# Patient Record
Sex: Male | Born: 1990 | Race: White | Hispanic: No | Marital: Single | Smoking: Never smoker
Health system: Southern US, Community
[De-identification: ages and names within clinical notes are randomized; demographics above are authoritative.]

## PROBLEM LIST (undated history)

## (undated) DIAGNOSIS — K4090 Unilateral inguinal hernia, without obstruction or gangrene, not specified as recurrent: Secondary | ICD-10-CM

## (undated) DIAGNOSIS — R51 Headache: Secondary | ICD-10-CM

## (undated) DIAGNOSIS — R519 Headache, unspecified: Secondary | ICD-10-CM

## (undated) HISTORY — PX: WISDOM TOOTH EXTRACTION: SHX21

## (undated) HISTORY — PX: FOREIGN BODY REMOVAL: SHX962

## (undated) HISTORY — DX: Unilateral inguinal hernia, without obstruction or gangrene, not specified as recurrent: K40.90

---

## 2016-03-06 ENCOUNTER — Encounter: Payer: Self-pay | Admitting: General Surgery

## 2016-03-06 ENCOUNTER — Other Ambulatory Visit: Payer: Self-pay

## 2016-03-06 ENCOUNTER — Ambulatory Visit (INDEPENDENT_AMBULATORY_CARE_PROVIDER_SITE_OTHER): Payer: Worker's Compensation | Admitting: General Surgery

## 2016-03-06 ENCOUNTER — Telehealth: Payer: Self-pay

## 2016-03-06 VITALS — BP 158/103 | HR 89 | Temp 98.3°F | Ht 70.0 in | Wt 254.0 lb

## 2016-03-06 DIAGNOSIS — K4090 Unilateral inguinal hernia, without obstruction or gangrene, not specified as recurrent: Secondary | ICD-10-CM | POA: Diagnosis not present

## 2016-03-06 NOTE — Progress Notes (Signed)
Patient ID: Scott Mckenzie, male   DOB: 02/26/1991, 25 y.o.   MRN: [id number]  CC: Left groin pain  HPI Scott Mckenzie is a 25 y.o. male who presents to clinic today for evaluation of left groin pain. Patient reports that 2 weeks ago to the day he was lifting a desk at work after which she felt a pop with gradual onset of pain to the groin. He thought area would resolve on its own but worsened overnight and had an obvious bulge the next morning. He sought medical care at that point and through an outpatient workup was found to have a symptomatic left inguinal hernia. The area has remained reducible but has been causing pain however he strains. He denies any fevers, chills, nausea, vomiting, chest pain, short of breath, diarrhea, constipation. He is otherwise a healthy 25 year old male.  HPI  Past Medical History:  Diagnosis Date  . Inguinal hernia    Left    Past Surgical History:  Procedure Laterality Date  . FOREIGN BODY REMOVAL     from ear- as child    Family History  Problem Relation Age of Onset  . Cancer Mother     Brain  . Diabetes Mother   . COPD Father   . Heart disease Father   . Healthy Sister     Social History Social History  Substance Use Topics  . Smoking status: Never Smoker  . Smokeless tobacco: Never Used  . Alcohol use Yes     Comment: 1-2 Beers weekly    Allergies  Allergen Reactions  . Penicillins Hives    No current outpatient prescriptions on file.   No current facility-administered medications for this visit.      Review of Systems A Multi-point review of systems was asked and was negative except for the findings documented in the history of present illness  Physical Exam Blood pressure (!) 158/103, pulse 89, temperature 98.3 F (36.8 C), temperature source Oral, height 5\' 10"  (1.778 m), weight 115.2 kg (254 lb). CONSTITUTIONAL: No acute distress. EYES: Pupils are equal, round, and reactive to light, Sclera are non-icteric. EARS, NOSE, MOUTH AND  THROAT: The oropharynx is clear. The oral mucosa is pink and moist. Hearing is intact to voice. LYMPH NODES:  Lymph nodes in the neck are normal. RESPIRATORY:  Lungs are clear. There is normal respiratory effort, with equal breath sounds bilaterally, and without pathologic use of accessory muscles. CARDIOVASCULAR: Heart is regular without murmurs, gallops, or rubs. GI: The abdomen is soft, tender to palpation in the left groin, and nondistended. There is an easily identifiable, reducible left inguinal hernia near the site of tenderness. No evidence of any right inguinal hernia on exam. There is no hepatosplenomegaly. There are normal bowel sounds in all quadrants. GU: Rectal deferred.   MUSCULOSKELETAL: Normal muscle strength and tone. No cyanosis or edema.   SKIN: Turgor is good and there are no pathologic skin lesions or ulcers. NEUROLOGIC: Motor and sensation is grossly normal. Cranial nerves are grossly intact. PSYCH:  Oriented to person, place and time. Affect is normal.  Data Reviewed Images were obtained at a different facility with a report showing evidence of a reducible left inguinal hernia. I have personally reviewed the patient's imaging, laboratory findings and medical records.    Assessment    Left inguinal hernia    Plan    25 year old male with a symptomatic left inguinal hernia. Had a long conversation of the treatment options to include open versus laparoscopic hernia repair.  Patient voiced understanding but an inability to choose between open or laparoscopic. His significant other is in the medical field but could not be present with him today and he will discuss with her which operation to choose. Patient is also dealing with a Workmen's Comp. situation secondary to this hernia. This may complicate his surgical care. Patient desires to have this surgery as soon as possible. Discussed that my soonest availability was not until [facility],  however, he could potentially have surgery next week by my partner Dr. Pat Patrick. He stated desire for surgery as soon as possible and will call on Monday to discuss which surgery he would like and whether or not he will proceed with Dr. Pat Patrick or myself.     Time spent with the patient was 45 minutes, with more than 50% of the time spent in face-to-face education, counseling and care coordination.     Clayburn Pert, MD FACS General Surgeon 03/06/2016, 11:55 AM

## 2016-03-06 NOTE — Telephone Encounter (Signed)
Dr. Adonis Huguenin told Scott Mckenzie that he was going to see if Dr. Pat Patrick can perform a surgery on him next week. Scott Mckenzie called to see if he was able to do that or not. Please call Scott Mckenzie and update him on Scott plan

## 2016-03-06 NOTE — Patient Instructions (Signed)
We have scheduled you for Surgery on 03/17/16 with Dr. Adonis Huguenin. Please call our office on Monday and let us know if you prefer to have Open or  Laparscopic. Inguinal Hernia,  Adult , Care After Refer to this sheet in the next few weeks. These discharge instructions provide you with general information on caring for yourself after you leave the hospital. Your caregiver may also give you specific instructions. Your treatment has been planned according to the most current medical practices available, but unavoidable complications sometimes occur. If you have any problems or questions after discharge, please call your caregiver. HOME CARE INSTRUCTIONS  Put ice on the operative site.  Put ice in a plastic bag.  Place a towel between your skin and the bag.  Leave the ice on for 15-20 minutes at a time, 03-04 times a day while awake.  Change bandages (dressings) as directed.  Keep the wound dry and clean. The wound may be washed gently with soap and water. Gently blot or dab the wound dry. It is okay to take showers 24 to 48 hours after surgery. Do not take baths, use swimming pools, or use hot tubs for 10 days, or as directed by your caregiver.  Only take over-the-counter or prescription medicines for pain, discomfort, or fever as directed by your caregiver.  Continue your normal diet as directed.  Do not lift anything more than 10 pounds or play contact sports for 3 weeks, or as directed. SEEK MEDICAL CARE IF:  There is redness, swelling, or increasing pain in the wound.  There is fluid (pus) coming from the wound.  There is drainage from a wound lasting longer than 1 day.  You have an oral temperature above 102 F (38.9 C).  You notice a bad smell coming from the wound or dressing.  The wound breaks open after the stitches (sutures) have been removed.  You notice increasing pain in the shoulders (shoulder strap areas).  You develop dizzy episodes or fainting while standing.  You  feel sick to your stomach (nauseous) or throw up (vomit). SEEK IMMEDIATE MEDICAL CARE IF:  You develop a rash.  You have difficulty breathing.  You develop a reaction or have side effects to medicines you were given. MAKE SURE YOU:   Understand these instructions.  Will watch your condition.  Will get help right away if you are not doing well or get worse.   This information is not intended to replace advice given to you by your health care provider. Make sure you discuss any questions you have with your health care provider.   Document Released: 07/09/2006 Document Revised: 06/29/2014 Document Reviewed: 12/10/2014 Elsevier Interactive Patient Education [employer].

## 2016-03-09 ENCOUNTER — Telehealth: Payer: Self-pay | Admitting: Surgery

## 2016-03-09 NOTE — Telephone Encounter (Signed)
Patient was given information below.   Sx: 03/12/16 with Dr Pat Patrick.  Laparoscopic left inguinal hernia repair, possible umbilical hernia repair.  Pre op: 03/11/16 between 1-5:00pm.--[facility].   Patient made aware to call [phone], between 1-3:00pm the day before surgery, to find out what time to arrive.

## 2016-03-11 ENCOUNTER — Encounter
Admission: RE | Admit: 2016-03-11 | Discharge: 2016-03-11 | Disposition: A | Discharge status: Home / Self Care | Payer: Worker's Compensation | Source: Ambulatory Visit | Attending: Surgery | Admitting: Surgery

## 2016-03-11 HISTORY — DX: Headache, unspecified: R51.9

## 2016-03-11 HISTORY — DX: Headache: R51

## 2016-03-11 MED ORDER — CLINDAMYCIN PHOSPHATE 900 MG/50ML IV SOLN
900.0000 mg | INTRAVENOUS | Status: AC
  Administered 2016-03-12: 900 mg via INTRAVENOUS

## 2016-03-11 NOTE — Patient Instructions (Signed)
  Your procedure is scheduled on: 03-12-16 Report to Same Day Surgery 2nd floor medical mall @ 6 AM PER PT    Remember: Instructions that are not followed completely may result in serious medical risk, up to and including death, or upon the discretion of your surgeon and anesthesiologist your surgery may need to be rescheduled.    _x___ 1. Do not eat food or drink liquids after midnight. No gum chewing or hard candies.     __x__ 2. No Alcohol for 24 hours before or after surgery.   __x__3. No Smoking for 24 prior to surgery.   ____  4. Bring all medications with you on the day of surgery if instructed.    __x__ 5. Notify your doctor if there is any change in your medical condition     (cold, fever, infections).     Do not wear jewelry, make-up, hairpins, clips or nail polish.  Do not wear lotions, powders, or perfumes. You may wear deodorant.  Do not shave 48 hours prior to surgery. Men may shave face and neck.  Do not bring valuables to the hospital.    [facility] is not responsible for any belongings or valuables.               Contacts, dentures or bridgework may not be worn into surgery.  Leave your suitcase in the car. After surgery it may be brought to your room.  For patients admitted to the hospital, discharge time is determined by your treatment team.   Patients discharged the day of surgery will not be allowed to drive home.    Please read over the following fact sheets that you were given:   [facility] Preparing for Surgery and or MRSA Information   ____ Take these medicines the morning of surgery with A SIP OF WATER:    1. NONE  2.  3.  4.  5.  6.  ____Fleets enema or Magnesium Citrate as directed.   ____ Use CHG Soap or sage wipes as directed on instruction sheet   ____ Use inhalers on the day of surgery and bring to hospital day of surgery  ____ Stop metformin 2 days prior to surgery    ____ Take 1/2 of usual insulin dose the night before surgery and  none on the morning of surgery.   ____ Stop aspirin or coumadin, or plavix  x__ Stop Anti-inflammatories such as Advil, Aleve, Ibuprofen, Motrin, Naproxen,          Naprosyn, Goodies powders or aspirin products. Ok to take Tylenol.   ____ Stop supplements until after surgery.    ____ Bring C-Pap to the hospital.

## 2016-03-12 ENCOUNTER — Ambulatory Visit: Payer: Worker's Compensation | Admitting: Anesthesiology

## 2016-03-12 ENCOUNTER — Ambulatory Visit
Admission: RE | Admit: 2016-03-12 | Discharge: 2016-03-12 | Disposition: A | Discharge status: Home / Self Care | Payer: Worker's Compensation | Source: Ambulatory Visit | Attending: Surgery | Admitting: Surgery

## 2016-03-12 ENCOUNTER — Encounter
Admission: RE | Disposition: A | Discharge status: Home / Self Care | Payer: Self-pay | Source: Ambulatory Visit | Attending: Surgery

## 2016-03-12 DIAGNOSIS — Z880 Allergy status to penicillin: Secondary | ICD-10-CM | POA: Insufficient documentation

## 2016-03-12 DIAGNOSIS — R1030 Lower abdominal pain, unspecified: Secondary | ICD-10-CM | POA: Insufficient documentation

## 2016-03-12 DIAGNOSIS — Z833 Family history of diabetes mellitus: Secondary | ICD-10-CM | POA: Diagnosis not present

## 2016-03-12 DIAGNOSIS — Z808 Family history of malignant neoplasm of other organs or systems: Secondary | ICD-10-CM | POA: Diagnosis not present

## 2016-03-12 DIAGNOSIS — Z9889 Other specified postprocedural states: Secondary | ICD-10-CM | POA: Diagnosis not present

## 2016-03-12 DIAGNOSIS — D176 Benign lipomatous neoplasm of spermatic cord: Secondary | ICD-10-CM | POA: Diagnosis not present

## 2016-03-12 DIAGNOSIS — Z825 Family history of asthma and other chronic lower respiratory diseases: Secondary | ICD-10-CM | POA: Diagnosis not present

## 2016-03-12 DIAGNOSIS — K4090 Unilateral inguinal hernia, without obstruction or gangrene, not specified as recurrent: Secondary | ICD-10-CM | POA: Diagnosis present

## 2016-03-12 HISTORY — PX: INGUINAL HERNIA REPAIR: SHX194

## 2016-03-12 SURGERY — REPAIR, HERNIA, INGUINAL, LAPAROSCOPIC
Anesthesia: General | Wound class: Clean

## 2016-03-12 MED ORDER — CHLORHEXIDINE GLUCONATE CLOTH 2 % EX PADS
6.0000 | MEDICATED_PAD | Freq: Once | CUTANEOUS | Status: AC
  Administered 2016-03-12: 6 via TOPICAL

## 2016-03-12 MED ORDER — LACTATED RINGERS IV SOLN
INTRAVENOUS | Status: DC
  Administered 2016-03-12 (×2): via INTRAVENOUS

## 2016-03-12 MED ORDER — CLINDAMYCIN PHOSPHATE 900 MG/50ML IV SOLN
INTRAVENOUS | Status: AC
  Administered 2016-03-12: 900 mg via INTRAVENOUS
  Filled 2016-03-12: qty 50

## 2016-03-12 MED ORDER — IPRATROPIUM-ALBUTEROL 0.5-2.5 (3) MG/3ML IN SOLN
RESPIRATORY_TRACT | Status: AC
  Administered 2016-03-12: 3 mL via RESPIRATORY_TRACT
  Filled 2016-03-12: qty 3

## 2016-03-12 MED ORDER — FENTANYL CITRATE (PF) 100 MCG/2ML IJ SOLN
INTRAMUSCULAR | Status: DC | PRN
  Administered 2016-03-12: 50 ug via INTRAVENOUS
  Administered 2016-03-12: 100 ug via INTRAVENOUS
  Administered 2016-03-12: 50 ug via INTRAVENOUS

## 2016-03-12 MED ORDER — MIDAZOLAM HCL 2 MG/2ML IJ SOLN
INTRAMUSCULAR | Status: DC | PRN
  Administered 2016-03-12: 2 mg via INTRAVENOUS

## 2016-03-12 MED ORDER — ONDANSETRON HCL 4 MG/2ML IJ SOLN: 4.0000 mg | Freq: Once | INTRAMUSCULAR | Status: DC | PRN

## 2016-03-12 MED ORDER — LIDOCAINE HCL (CARDIAC) 20 MG/ML IV SOLN
INTRAVENOUS | Status: DC | PRN
  Administered 2016-03-12: 30 mg via INTRAVENOUS

## 2016-03-12 MED ORDER — ONDANSETRON HCL 4 MG/2ML IJ SOLN
INTRAMUSCULAR | Status: DC | PRN
  Administered 2016-03-12: 4 mg via INTRAVENOUS

## 2016-03-12 MED ORDER — PROPOFOL 10 MG/ML IV BOLUS
INTRAVENOUS | Status: DC | PRN
  Administered 2016-03-12: 250 mg via INTRAVENOUS

## 2016-03-12 MED ORDER — DEXAMETHASONE SODIUM PHOSPHATE 10 MG/ML IJ SOLN
INTRAMUSCULAR | Status: DC | PRN
  Administered 2016-03-12: 10 mg via INTRAVENOUS

## 2016-03-12 MED ORDER — BUPIVACAINE HCL (PF) 0.25 % IJ SOLN
INTRAMUSCULAR | Status: AC
  Filled 2016-03-12: qty 30

## 2016-03-12 MED ORDER — HYDROCODONE-ACETAMINOPHEN 5-325 MG PO TABS
ORAL_TABLET | ORAL | Status: AC
  Filled 2016-03-12: qty 1

## 2016-03-12 MED ORDER — SUGAMMADEX SODIUM 500 MG/5ML IV SOLN
INTRAVENOUS | Status: DC | PRN
  Administered 2016-03-12: 226.8 mg via INTRAVENOUS

## 2016-03-12 MED ORDER — FENTANYL CITRATE (PF) 100 MCG/2ML IJ SOLN
INTRAMUSCULAR | Status: AC
  Administered 2016-03-12: 25 ug via INTRAVENOUS
  Filled 2016-03-12: qty 2

## 2016-03-12 MED ORDER — ROCURONIUM BROMIDE 100 MG/10ML IV SOLN
INTRAVENOUS | Status: DC | PRN
  Administered 2016-03-12: 50 mg via INTRAVENOUS
  Administered 2016-03-12: 20 mg via INTRAVENOUS
  Administered 2016-03-12: 10 mg via INTRAVENOUS

## 2016-03-12 MED ORDER — IPRATROPIUM-ALBUTEROL 0.5-2.5 (3) MG/3ML IN SOLN
3.0000 mL | Freq: Once | RESPIRATORY_TRACT | Status: AC
  Administered 2016-03-12: 3 mL via RESPIRATORY_TRACT

## 2016-03-12 MED ORDER — BUPIVACAINE HCL (PF) 0.5 % IJ SOLN
INTRAMUSCULAR | Status: DC | PRN
  Administered 2016-03-12: 30 mL

## 2016-03-12 MED ORDER — HYDROCODONE-ACETAMINOPHEN 5-325 MG PO TABS
1.0000 | ORAL_TABLET | Freq: Four times a day (QID) | ORAL | Status: AC | PRN
  Administered 2016-03-12: 1 via ORAL

## 2016-03-12 MED ORDER — FENTANYL CITRATE (PF) 100 MCG/2ML IJ SOLN
25.0000 ug | INTRAMUSCULAR | Status: DC | PRN
  Administered 2016-03-12 (×4): 25 ug via INTRAVENOUS

## 2016-03-12 MED ORDER — KETOROLAC TROMETHAMINE 30 MG/ML IJ SOLN
INTRAMUSCULAR | Status: DC | PRN
  Administered 2016-03-12: 30 mg via INTRAVENOUS

## 2016-03-12 MED ORDER — FAMOTIDINE 20 MG PO TABS
20.0000 mg | ORAL_TABLET | Freq: Once | ORAL | Status: AC
  Administered 2016-03-12: 20 mg via ORAL

## 2016-03-12 MED ORDER — HYDROCODONE-ACETAMINOPHEN 5-325 MG PO TABS: 1.0000 | ORAL_TABLET | Freq: Four times a day (QID) | ORAL | 0 refills | Status: DC | PRN

## 2016-03-12 MED ORDER — FAMOTIDINE 20 MG PO TABS
ORAL_TABLET | ORAL | Status: AC
  Administered 2016-03-12: 20 mg via ORAL
  Filled 2016-03-12: qty 1

## 2016-03-12 SURGICAL SUPPLY — 56 items
BLADE CLIPPER SURG (BLADE) ×4 IMPLANT
BLADE SURG 15 STRL LF DISP TIS (BLADE) ×2 IMPLANT
BLADE SURG 15 STRL SS (BLADE) ×2
CANISTER SUCT 1200ML W/VALVE (MISCELLANEOUS) ×4 IMPLANT
CHLORAPREP W/TINT 26ML (MISCELLANEOUS) ×4 IMPLANT
CLOSURE WOUND 1/2 X4 (GAUZE/BANDAGES/DRESSINGS)
DEVICE PMI PUNCTURE CLOSURE (MISCELLANEOUS) ×4 IMPLANT
DEVICE SECURE STRAP 25 ABSORB (INSTRUMENTS) IMPLANT
DISSECT BALLN SPACEMKR OVL PDB (BALLOONS)
DISSECT BALLN SPACEMKR RND PDB (MISCELLANEOUS)
DISSECTOR BALLN SPCMKR OVL PDB (BALLOONS) IMPLANT
DISSECTOR BALLN SPCMKR RND PDB (MISCELLANEOUS) IMPLANT
DRAPE LAPAROTOMY 100X77 ABD (DRAPES) IMPLANT
DRSG TEGADERM 2-3/8X2-3/4 SM (GAUZE/BANDAGES/DRESSINGS) ×12 IMPLANT
DRSG TELFA 3X8 NADH (GAUZE/BANDAGES/DRESSINGS) ×4 IMPLANT
ELECT CAUTERY NEEDLE TIP 1.0 (MISCELLANEOUS) ×4
ELECT REM PT RETURN 9FT ADLT (ELECTROSURGICAL) ×4
ELECTRODE CAUTERY NEDL TIP 1.0 (MISCELLANEOUS) ×2 IMPLANT
ELECTRODE REM PT RTRN 9FT ADLT (ELECTROSURGICAL) ×2 IMPLANT
GAUZE SPONGE 4X4 12PLY STRL (GAUZE/BANDAGES/DRESSINGS) IMPLANT
GLOVE BIO SURGEON STRL SZ7.5 (GLOVE) ×16 IMPLANT
GLOVE INDICATOR 8.0 STRL GRN (GLOVE) ×8 IMPLANT
GOWN STRL REUS W/ TWL LRG LVL3 (GOWN DISPOSABLE) ×4 IMPLANT
GOWN STRL REUS W/TWL LRG LVL3 (GOWN DISPOSABLE) ×4
IRRIGATION STRYKERFLOW (MISCELLANEOUS) IMPLANT
IRRIGATOR STRYKERFLOW (MISCELLANEOUS)
IV NS 1000ML (IV SOLUTION)
IV NS 1000ML BAXH (IV SOLUTION) IMPLANT
KIT RM TURNOVER STRD PROC AR (KITS) ×4 IMPLANT
LABEL OR SOLS (LABEL) ×4 IMPLANT
MESH HERNIA 4X6 PROLITE RECT (Mesh General) ×2 IMPLANT
MESH POLY 4X6 (Mesh General) ×2 IMPLANT
NEEDLE HYPO 25X1 1.5 SAFETY (NEEDLE) ×4 IMPLANT
NEEDLE INSUFFLATION 14GA 120MM (NEEDLE) ×4 IMPLANT
NS IRRIG 500ML POUR BTL (IV SOLUTION) ×4 IMPLANT
PACK BASIN MINOR ARMC (MISCELLANEOUS) ×4 IMPLANT
PACK LAP CHOLECYSTECTOMY (MISCELLANEOUS) ×4 IMPLANT
SCISSORS METZENBAUM CVD 33 (INSTRUMENTS) ×4 IMPLANT
SLEEVE ADV FIXATION 5X100MM (TROCAR) ×4 IMPLANT
STAPLER SKIN PROX 35W (STAPLE) IMPLANT
STRAP SAFETY BODY (MISCELLANEOUS) ×4 IMPLANT
STRIP CLOSURE SKIN 1/2X4 (GAUZE/BANDAGES/DRESSINGS) IMPLANT
SURGILUBE 2OZ TUBE FLIPTOP (MISCELLANEOUS) ×4 IMPLANT
SUT ETHILON 5-0 FS-2 18 BLK (SUTURE) ×4 IMPLANT
SUT SILK 3 0 (SUTURE)
SUT SILK 3-0 18XBRD TIE 12 (SUTURE) IMPLANT
SUT VIC AB 0 CT2 27 (SUTURE) ×4 IMPLANT
SUT VICRYL+ 3-0 144IN (SUTURE) IMPLANT
SUT VICRYL+ 3-0 36IN CT-1 (SUTURE) IMPLANT
SYRINGE 10CC LL (SYRINGE) IMPLANT
TACKER 5MM HERNIA 3.5CML NAB (ENDOMECHANICALS) ×4 IMPLANT
TRAY FOLEY W/METER SILVER 16FR (SET/KITS/TRAYS/PACK) ×4 IMPLANT
TROCAR BALLN 10M OMST10SB SPAC (TROCAR) IMPLANT
TROCAR Z-THREAD FIOS 11X100 BL (TROCAR) ×4 IMPLANT
TROCAR Z-THREAD OPTICAL 5X100M (TROCAR) ×4 IMPLANT
TUBING INSUFFLATOR HI FLOW (MISCELLANEOUS) ×4 IMPLANT

## 2016-03-12 NOTE — Op Note (Signed)
03/12/2016  9:10 AM  PATIENT:  Scott Mckenzie  25 y.o. male  PRE-OPERATIVE DIAGNOSIS:  LEFT INGUINAL HERNIA  POST-OPERATIVE DIAGNOSIS:  LEFT INGUINAL HERNIA  PROCEDURE:  Procedure(s): LAPAROSCOPIC INGUINAL HERNIA (Left)  SURGEON:  Surgeon(s) and Role:    * Dia Crawford III, MD - Primary   ASSISTANTS: none   ANESTHESIA:   general  EBL:  Total I/O In: -  Out: 175 [Urine:150; Blood:25]   DRAINS: none   LOCAL MEDICATIONS USED:  MARCAINE      DISPOSITION OF SPECIMEN:  N/A   DICTATION: .Dragon Dictation  With the patient in supine position and after induction of appropriate general anesthesia and placement of Foley catheter the patient's abdomen was prepped with ChloraPrep and draped sterile towels. Patient placed headdown feet up position. Small infraumbilical incision was made standard fashion carried down bluntly through subcutaneous tissue. First needle was used to cannulate peritoneal cavity but I could not get adequate pressure measurements. Using the Optiview trocar the abdomen was then cannulated without difficulty. The camera was inserted and CO2 was reinsufflated. Position was confirmed. The left lower quadrant and the left colon was densely adherent to the lateral sidewall in what appeared to be a previous inflammatory episodes. No obvious indirect or direct hernia was identified. The bowel was taken down gently to avoid any bowel damage. The peritoneum was opened to expose the cord structures. There was an indirect defect noted with a large cord lipoma. The lipoma was reduced into the abdominal cavity. Peritoneal structures were dissected over to the umbilical fold. Cooper's ligament was identified. The epigastric vessels were identified as were the vas deferens and cord blood vessels. A piece of Atrium mesh was brought to the table appropriately fashioned inserted into the preperitoneal space. It was tacked medially to Cooper's ligament up around the aponeurotic arch. No tacks were  placed in the inferior or lateral aspects.  The peritoneum was again mobilized as it was quite contracted and the area of the previous sigmoid problem. The peritoneum was then tacked back over the mesh to exclude it from the GI contents. The repair appeared to be satisfactory. Again was desufflated and all ports withdrawn without difficulty. Skin incisions were closed with 5-0 nylon after closing the umbilical defect with interrupted sutures figure-of-eight suture of 0 Vicryl. Skin was infiltrated with 0.25% Marcaine for postoperative pain control. Sterile dressings were applied. The patient returned recovery room in satisfactory condition. Sponge instrument and needle count were correct 2 in the operating room.  PLAN OF CARE: Discharge to home after PACU  PATIENT DISPOSITION:  PACU - hemodynamically stable.   Dia Crawford III, MD

## 2016-03-12 NOTE — Transfer of Care (Signed)
Immediate Anesthesia Transfer of Care Note  Patient: Junior Fetterhoff  Procedure(s) Performed: Procedure(s): LAPAROSCOPIC INGUINAL HERNIA (Left)  Patient Location: PACU  Anesthesia Type:General  Level of Consciousness: awake, alert  and oriented  Airway & Oxygen Therapy: Patient Spontanous Breathing and Patient connected to face mask oxygen  Post-op Assessment: Report given to RN and Post -op Vital signs reviewed and stable  Post vital signs: Reviewed and stable  Last Vitals:  Vitals:   03/12/16 0611 03/12/16 0918  BP: (!) 141/101 (!) 149/91  Pulse: 86 97  Resp: 16 12  Temp: 36.8 C 36.9 C    Last Pain:  Vitals:   03/12/16 0611  TempSrc: Tympanic         Complications: No apparent anesthesia complications

## 2016-03-12 NOTE — Discharge Instructions (Signed)
AMBULATORY SURGERY  DISCHARGE INSTRUCTIONS   1) The drugs that you were given will stay in your system until tomorrow so for the next 24 hours you should not:  A) Drive an automobile B) Make any legal decisions C) Drink any alcoholic beverage   2) You may resume regular meals tomorrow.  Today it is better to start with liquids and gradually work up to solid foods.  You may eat anything you prefer, but it is better to start with liquids, then soup and crackers, and gradually work up to solid foods.   3) Please notify your doctor immediately if you have any unusual bleeding, trouble breathing, redness and pain at the surgery site, drainage, fever, or pain not relieved by medication.    4) Additional Instructions:        Please contact your physician with any problems or Same Day Surgery at [phone], Monday through Friday 6 am to 4 pm, or [place] [facility] number at [phone].Inguinal Hernia, Adult Muscles help keep everything in the body in its proper place. But if a weak spot in the muscles develops, something can poke through. That is called a hernia. When this happens in the lower part of the belly (abdomen), it is called an inguinal hernia. (It takes its name from a part of the body in this region called the inguinal canal.) A weak spot in the wall of muscles lets some fat or part of the small intestine bulge through. An inguinal hernia can develop at any age. Men get them more often than women. CAUSES  In adults, an inguinal hernia develops over time.  It can be triggered by:  Suddenly straining the muscles of the lower abdomen.  Lifting heavy objects.  Straining to have a bowel movement. Difficult bowel movements (constipation) can lead to this.  Constant coughing. This may be caused by smoking or lung disease.  Being overweight.  Being pregnant.  Working at a job that requires long periods of standing or heavy lifting.  Having had an inguinal  hernia before. One type can be an emergency situation. It is called a strangulated inguinal hernia. It develops if part of the small intestine slips through the weak spot and cannot get back into the abdomen. The blood supply can be cut off. If that happens, part of the intestine may die. This situation requires emergency surgery. SYMPTOMS  Often, a small inguinal hernia has no symptoms. It is found when a healthcare provider does a physical exam. Larger hernias usually have symptoms.   In adults, symptoms may include:  A lump in the groin. This is easier to see when the person is standing. It might disappear when lying down.  In men, a lump in the scrotum.  Pain or burning in the groin. This occurs especially when lifting, straining or coughing.  A dull ache or feeling of pressure in the groin.  Signs of a strangulated hernia can include:  A bulge in the groin that becomes very painful and tender to the touch.  A bulge that turns red or purple.  Fever, nausea and vomiting.  Inability to have a bowel movement or to pass gas. DIAGNOSIS  To decide if you have an inguinal hernia, a healthcare provider will probably do a physical examination.  This will include asking questions about any symptoms you have noticed.  The healthcare provider might feel the groin area and ask you to cough. If an inguinal hernia is felt, the healthcare provider may try  to slide it back into the abdomen.  Usually no other tests are needed. TREATMENT  Treatments can vary. The size of the hernia makes a difference. Options include:  Watchful waiting. This is often suggested if the hernia is small and you have had no symptoms.  No medical procedure will be done unless symptoms develop.  You will need to watch closely for symptoms. If any occur, contact your healthcare provider right away.  Surgery. This is used if the hernia is larger or you have symptoms.  Open surgery. This is usually an outpatient  procedure (you will not stay overnight in a hospital). An cut (incision) is made through the skin in the groin. The hernia is put back inside the abdomen. The weak area in the muscles is then repaired by herniorrhaphy or hernioplasty. Herniorrhaphy: in this type of surgery, the weak muscles are sewn back together. Hernioplasty: a patch or mesh is used to close the weak area in the abdominal wall.  Laparoscopy. In this procedure, a surgeon makes small incisions. A thin tube with a tiny video camera (called a laparoscope) is put into the abdomen. The surgeon repairs the hernia with mesh by looking with the video camera and using two long instruments. HOME CARE INSTRUCTIONS   After surgery to repair an inguinal hernia:  You will need to take pain medicine prescribed by your healthcare provider. Follow all directions carefully.  You will need to take care of the wound from the incision.  Your activity will be restricted for awhile. This will probably include no heavy lifting for several weeks. You also should not do anything too active for a few weeks. When you can return to work will depend on the type of job that you have.  During "watchful waiting" periods, you should:  Maintain a healthy weight.  Eat a diet high in fiber (fruits, vegetables and whole grains).  Drink plenty of fluids to avoid constipation. This means drinking enough water and other liquids to keep your urine clear or pale yellow.  Do not lift heavy objects.  Do not stand for long periods of time.  Quit smoking. This should keep you from developing a frequent cough. SEEK MEDICAL CARE IF:   A bulge develops in your groin area.  You feel pain, a burning sensation or pressure in the groin. This might be worse if you are lifting or straining.  You develop a fever of more than 100.5 F (38.1 C). SEEK IMMEDIATE MEDICAL CARE IF:   Pain in the groin increases suddenly.  A bulge in the groin gets bigger suddenly and does  not go down.  For men, there is sudden pain in the scrotum. Or, the size of the scrotum increases.  A bulge in the groin area becomes red or purple and is painful to touch.  You have nausea or vomiting that does not go away.  You feel your heart beating much faster than normal.  You cannot have a bowel movement or pass gas.  You develop a fever of more than 102.0 F (38.9 C).   This information is not intended to replace advice given to you by your health care provider. Make sure you discuss any questions you have with your health care provider.   Document Released: 10/25/2008 Document Revised: 08/31/2011 Document Reviewed: 12/10/2014 Elsevier Interactive Patient Education [employer].

## 2016-03-12 NOTE — Anesthesia Procedure Notes (Signed)
Procedure Name: Intubation Date/Time: 03/12/2016 7:33 AM Performed by: Jonna Clark Pre-anesthesia Checklist: Patient identified, Patient being monitored, Timeout performed, Emergency Drugs available and Suction available Patient Re-evaluated:Patient Re-evaluated prior to inductionOxygen Delivery Method: Circle system utilized Preoxygenation: Pre-oxygenation with 100% oxygen Intubation Type: IV induction Ventilation: Mask ventilation without difficulty Laryngoscope Size: Miller and 2 Grade View: Grade I Tube type: Oral Tube size: 7.5 mm Number of attempts: 1 Placement Confirmation: ETT inserted through vocal cords under direct vision,  positive ETCO2 and breath sounds checked- equal and bilateral Secured at: 21 cm Tube secured with: Tape Dental Injury: Teeth and Oropharynx as per pre-operative assessment

## 2016-03-12 NOTE — Anesthesia Preprocedure Evaluation (Signed)
Anesthesia Evaluation  Patient identified by MRN, date of birth, ID band Patient awake    Reviewed: Allergy & Precautions, NPO status , Patient's Chart, lab work & pertinent test results  Airway Mallampati: II       Dental no notable dental hx. (+) Chipped   Pulmonary neg pulmonary ROS,    Pulmonary exam normal        Cardiovascular negative cardio ROS Normal cardiovascular exam     Neuro/Psych  Headaches, negative psych ROS   GI/Hepatic Neg liver ROS, Inguinal and umbilical hernia   Endo/Other  negative endocrine ROS  Renal/GU negative Renal ROS  negative genitourinary   Musculoskeletal negative musculoskeletal ROS (+)   Abdominal Normal abdominal exam  (+)   Peds negative pediatric ROS (+)  Hematology negative hematology ROS (+)   Anesthesia Other Findings   Reproductive/Obstetrics                             Anesthesia Physical Anesthesia Plan  ASA: II  Anesthesia Plan: General   Post-op Pain Management:    Induction: Intravenous  Airway Management Planned: Oral ETT  Additional Equipment:   Intra-op Plan:   Post-operative Plan: Extubation in OR  Informed Consent: I have reviewed the patients History and Physical, chart, labs and discussed the procedure including the risks, benefits and alternatives for the proposed anesthesia with the patient or authorized representative who has indicated his/her understanding and acceptance.     Plan Discussed with: CRNA and Surgeon  Anesthesia Plan Comments:         Anesthesia Quick Evaluation

## 2016-03-13 NOTE — Anesthesia Postprocedure Evaluation (Signed)
Anesthesia Post Note  Patient: Scott Mckenzie  Procedure(s) Performed: Procedure(s) (LRB): LAPAROSCOPIC INGUINAL HERNIA (Left)  Patient location during evaluation: PACU Anesthesia Type: General Level of consciousness: awake and alert and oriented Pain management: pain level controlled Vital Signs Assessment: post-procedure vital signs reviewed and stable Respiratory status: spontaneous breathing Cardiovascular status: blood pressure returned to baseline Anesthetic complications: no    Last Vitals:  Vitals:   03/12/16 1045 03/12/16 1100  BP: (!) 147/96 (!) 158/99  Pulse: (!) 108 (!) 103  Resp: 16   Temp: 36.3 C     Last Pain:  Vitals:   03/13/16 0831  TempSrc:   PainSc: 1                  Justen Fonda

## 2016-03-18 ENCOUNTER — Encounter: Payer: Self-pay | Admitting: General Surgery

## 2016-03-18 ENCOUNTER — Ambulatory Visit (INDEPENDENT_AMBULATORY_CARE_PROVIDER_SITE_OTHER): Payer: Worker's Compensation | Admitting: General Surgery

## 2016-03-18 ENCOUNTER — Other Ambulatory Visit
Admission: RE | Admit: 2016-03-18 | Discharge: 2016-03-18 | Disposition: A | Discharge status: Home / Self Care | Payer: Worker's Compensation | Source: Ambulatory Visit | Attending: General Surgery | Admitting: General Surgery

## 2016-03-18 ENCOUNTER — Telehealth: Payer: Self-pay | Admitting: General Surgery

## 2016-03-18 ENCOUNTER — Other Ambulatory Visit: Payer: Self-pay

## 2016-03-18 VITALS — BP 138/92 | HR 97 | Temp 97.6°F | Ht 70.0 in | Wt 253.0 lb

## 2016-03-18 DIAGNOSIS — Z4889 Encounter for other specified surgical aftercare: Secondary | ICD-10-CM

## 2016-03-18 DIAGNOSIS — IMO0001 Reserved for inherently not codable concepts without codable children: Secondary | ICD-10-CM

## 2016-03-18 DIAGNOSIS — R350 Frequency of micturition: Secondary | ICD-10-CM

## 2016-03-18 DIAGNOSIS — Z09 Encounter for follow-up examination after completed treatment for conditions other than malignant neoplasm: Secondary | ICD-10-CM | POA: Diagnosis present

## 2016-03-18 LAB — URINALYSIS COMPLETE WITH MICROSCOPIC (ARMC ONLY)
BACTERIA UA: NONE SEEN
Bilirubin Urine: NEGATIVE
GLUCOSE, UA: NEGATIVE mg/dL
HGB URINE DIPSTICK: NEGATIVE
KETONES UR: NEGATIVE mg/dL
Leukocytes, UA: NEGATIVE
Nitrite: NEGATIVE
Protein, ur: NEGATIVE mg/dL
SPECIFIC GRAVITY, URINE: 1.025 (ref 1.005–1.030)
Squamous Epithelial / LPF: NONE SEEN
WBC UA: NONE SEEN WBC/hpf (ref 0–5)
pH: 6 (ref 5.0–8.0)

## 2016-03-18 MED ORDER — HYDROCODONE-ACETAMINOPHEN 5-325 MG PO TABS: 1.0000 | ORAL_TABLET | Freq: Four times a day (QID) | ORAL | 0 refills | Status: DC | PRN

## 2016-03-18 NOTE — Patient Instructions (Signed)
Please call our office if you have questions or concerns.  We would like for you to go to the lab today and leave a urine specimen. We will call you with the results.

## 2016-03-18 NOTE — Telephone Encounter (Signed)
Patient is complaining of testicular pain, Post Surgery with Dr Pat Patrick 06/09/16 Left laparoscopic hernia repair.-- and would like to be seen sooner. Appointment moved from 03/19/16 to 03/18/16 with Dr Adonis Huguenin.

## 2016-03-19 ENCOUNTER — Encounter: Payer: Self-pay | Admitting: General Surgery

## 2016-03-19 ENCOUNTER — Telehealth: Payer: Self-pay

## 2016-03-19 NOTE — Progress Notes (Signed)
Outpatient Surgical Follow Up  03/19/2016  Scott Mckenzie is an 25 y.o. male.   Chief Complaint  Patient presents with  . Routine Post Op    Laparoscopy Left Inguinal Hernia repair-Dr.ely-03/12/16    HPI: 25 year old male returns to clinic 1 week status post laparoscopic left inguinal hernia repair. Patient reports a pulling and shooting sensation down into his left testicle. He states he does feel better than he did before surgery. He is also complaining of some urinary frequency but no burning on urination. He denies any fevers, chills, nausea, vomiting, chest pain, shortness of breath, diarrhea, conservation.  Past Medical History:  Diagnosis Date  . Headache    MIGRAINES RARE  . Inguinal hernia    Left    Past Surgical History:  Procedure Laterality Date  . FOREIGN BODY REMOVAL     from ear- as child  . INGUINAL HERNIA REPAIR Left 03/12/2016   Procedure: LAPAROSCOPIC INGUINAL HERNIA;  Surgeon: Dia Crawford III, MD;  Location: [facility] ORS;  Service: General;  Laterality: Left;  . WISDOM TOOTH EXTRACTION      Family History  Problem Relation Age of Onset  . Cancer Mother     Brain  . Diabetes Mother   . COPD Father   . Heart disease Father   . Healthy Sister     Social History:  reports that he has never smoked. He has never used smokeless tobacco. He reports that he drinks alcohol. He reports that he does not use drugs.  Allergies:  Allergies  Allergen Reactions  . Penicillins Hives    Medications reviewed.    ROS A multipoint review of systems was completed. All pertinent positives and negatives are documented within the history of present illness and remainder are negative.   BP (!) 138/92   Pulse 97   Temp 97.6 F (36.4 C) (Oral)   Ht 5\' 10"  (1.778 m)   Wt 114.8 kg (253 lb)   BMI 36.30 kg/m   Physical Exam Gen.: No acute distress Chest: Clear to auscultation Heart: Regular rhythm Abdomen: Soft, nontender, nondistended. Well approximately lap scopic  incision sites that any evidence of erythema or drainage. There is some ecchymosis at the umbilical site but no evidence purulence. Sutures in place. Groin: Bilateral groins examined with no evidence of hernia recurrence on the left or hernia formation on the right. The left testicle is minimally ecchymotic but without any evidence of ischemia.    No results found for this or any previous visit (from the past 48 hour(s)). No results found.  Assessment/Plan:  1. Aftercare following surgery Patient doing well 1 week status post laparoscopic inguinal hernia repair. Continues to have some discomfort which is normal at 1 week postop. We'll provide him with an additional prescription for pain medications and counseled him as to the usual healing process and wound care precautions. He'll follow-up in clinic on an as-needed basis.  2. Frequency Patient with urinary frequency but no burning. We'll obtain a urinalysis and either treat him for bladder spasms or antibiotics for a urinary tract infection pending the result. - Urinalysis with microscopic; Future - Urine culture; Future     Clayburn Pert, MD [facility] General Surgeon  03/19/2016,9:33 AM

## 2016-03-19 NOTE — Telephone Encounter (Signed)
Patient notified of normal urine results. Patient instructed to drink cranberry juice. Increase water, and try AZO and see if this helps with his frequency. Patient verbalized understanding.

## 2016-03-20 LAB — URINE CULTURE: CULTURE: NO GROWTH

## 2016-03-24 ENCOUNTER — Telehealth: Payer: Self-pay

## 2016-03-24 NOTE — Telephone Encounter (Signed)
Patient's workers compensation representative: Solicitor requested a letter stating that patient is out of work. However, I contacted patient and told him that since we are seeing him tomorrow, that I would write the letter until then so we could have the correct return work date. Patient agreed and stated that he would remind [place] tomorrow after his appointment.

## 2016-03-24 NOTE — Telephone Encounter (Signed)
-----   Message from Gavin Pound, [place] sent at 03/20/2016 11:59 AM EDT ----- Regarding: Disability  Robin Pallastorone Fax [phone]  Wants a letter saying that he is not working as of right now.   Thank you!

## 2016-03-25 ENCOUNTER — Ambulatory Visit (INDEPENDENT_AMBULATORY_CARE_PROVIDER_SITE_OTHER): Payer: Worker's Compensation | Admitting: Surgery

## 2016-03-25 ENCOUNTER — Encounter: Payer: Self-pay | Admitting: Surgery

## 2016-03-25 VITALS — BP 148/89 | HR 79 | Temp 97.8°F | Wt 255.0 lb

## 2016-03-25 DIAGNOSIS — K4090 Unilateral inguinal hernia, without obstruction or gangrene, not specified as recurrent: Secondary | ICD-10-CM

## 2016-03-25 NOTE — Telephone Encounter (Signed)
Patient's letter to return to work on Monday was faxed to [employer] (patient's case representative).

## 2016-03-25 NOTE — Progress Notes (Signed)
Outpatient postop visit  03/25/2016  Scott Mckenzie is an 25 y.o. male.    Procedure: Laparoscopic inguinal hernia repair, Dr. 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    Physical Exam:  BP (!) 148/89   Pulse 79   Temp 97.8 F (36.6 C)   Wt 255 lb (115.7 kg)   BMI 36.59 kg/m     PE: No erythema no drainage noted at this time. No purulence no ecchymosis testicular tenderness is minimal testicular cord is palpated were patient senses a mass    Assessment/Plan:  Patient doing very well he could return to work at any time once his pain is improved. He does no heavy lifting but sits for a long period of time at his job. He'll follow-up with [place] on an as-needed basis  Florene Glen, MD, FACS

## 2016-03-27 ENCOUNTER — Telehealth: Payer: Self-pay | Admitting: Surgery

## 2016-03-27 NOTE — Telephone Encounter (Signed)
Patient has called and is requesting a work note. He states that he is still very uncomfortable with general post operative pain and feels that he needs one more week of work off. He would like to return to work on 04/06/16. Please call patient to inform him when this is completed.

## 2016-03-27 NOTE — Telephone Encounter (Signed)
Spoke with patient at this time after reviewing Dr. Antionette Char last office note. Explained to patient that if he is not feeling well enough to return, then he will need to be seen in the office by a provider to extend this. Patient complains of still needing to use Narcotic pain medication to control pain intermittently and has an hour drive to work which he does not feel he can do at this time. He is asking for a work note when he is seen for Monday and Tuesday. We can provide this for patient at his appointment on Tuesday and this has been explained to the patient and he verbalizes understanding.

## 2016-03-31 ENCOUNTER — Ambulatory Visit (INDEPENDENT_AMBULATORY_CARE_PROVIDER_SITE_OTHER): Payer: Worker's Compensation | Admitting: Surgery

## 2016-03-31 ENCOUNTER — Encounter: Payer: Self-pay | Admitting: Surgery

## 2016-03-31 DIAGNOSIS — K4090 Unilateral inguinal hernia, without obstruction or gangrene, not specified as recurrent: Secondary | ICD-10-CM

## 2016-03-31 MED ORDER — GABAPENTIN 300 MG PO CAPS: 300.0000 mg | ORAL_CAPSULE | Freq: Two times a day (BID) | ORAL | 0 refills | Status: DC

## 2016-03-31 MED ORDER — GABAPENTIN 300 MG PO CAPS: 300.0000 mg | ORAL_CAPSULE | Freq: Three times a day (TID) | ORAL | 0 refills | Status: DC

## 2016-03-31 NOTE — Progress Notes (Signed)
03/31/2016  HPI: Patient is status post laparoscopic left inguinal hernia repair with mesh with Dr. Pat Patrick on 9/21. He has been seen in the office on 2 separate occasions due to persistent left groin pain. The patient recognizes this as intermittent shooting pain from his left lower quadrant down his left scrotum. So far his incisions have been healing well and there is no evidence of infection or new hernia recurrence.  Vital signs: BP (!) 149/100   Pulse 84   Temp 97.8 F (36.6 C) (Oral)   Ht 5\' 10"  (1.778 m)   Wt 116.1 kg (256 lb)   BMI 36.73 kg/m    Physical Exam: Constitutional: No acute distress Abdomen: Soft, nondistended, nontender to palpation. Incisions have healed well. There is no evidence of infection or hernia recurrence. Scrotum: There is no swelling induration or abscess over the left scrotum. There is tenderness to palpation on the upper portion of the scrotum over the external ring area. There is no ecchymosis or cellulitis.  Assessment/Plan: This a 25 year old male status post laparoscopic left inguinal hernia repair with mesh with Dr. Pat Patrick. Patient still having pain symptoms particularly shooting pain down his left scrotum. This may be nerve-type of pain and have explained the patient this could be related from inflammation from the surgery itself or mesh placement or peritoneal closure. The pain is intermittent at this point which is reassuring.  Have recommended to the patient that he can start wearing a jockstrap to help with comfort as well as taking ibuprofen 600 mg 3 times a day as needed for pain for many potentially inflammation. We will also give him a prescription for Neurontin 300 mg twice daily that he can take as well for nerve-type of pain. He may otherwise follow up with [place] on an as-needed basis particularly if his symptoms were not to improve. The patient is also asked for his return to work date to be next Monday 10/16 and we will provide him a work note for  this. The patient understands all plans and all his pressures have been answered.   Melvyn Neth, [place]

## 2016-03-31 NOTE — Patient Instructions (Addendum)
We have provided a return to work note for you today. Please call our office if you have any questions or concerns. We have sent your medicine to the pharmacy. You can also try ibuprofen. You may also wear a jock strap for comfort.

## 2016-04-01 ENCOUNTER — Telehealth: Payer: Self-pay

## 2016-04-01 NOTE — Telephone Encounter (Signed)
Robin Pallastorone called stating that she is the patient's case worker and was requesting a copy of the doctor's disability certificate that was provided by the doctor and patient's office visit notes to be faxed at [phone]. This will be faxed.

## 2016-08-27 ENCOUNTER — Emergency Department
Admission: EM | Admit: 2016-08-27 | Discharge: 2016-08-27 | Disposition: A | Discharge status: Home / Self Care | Payer: Worker's Compensation | Attending: Emergency Medicine | Admitting: Emergency Medicine

## 2016-08-27 ENCOUNTER — Emergency Department: Payer: Worker's Compensation

## 2016-08-27 DIAGNOSIS — R1032 Left lower quadrant pain: Secondary | ICD-10-CM | POA: Insufficient documentation

## 2016-08-27 DIAGNOSIS — R1030 Lower abdominal pain, unspecified: Secondary | ICD-10-CM

## 2016-08-27 LAB — CBC
HEMATOCRIT: 41.8 % (ref 40.0–52.0)
HEMOGLOBIN: 14.7 g/dL (ref 13.0–18.0)
MCH: 31.6 pg (ref 26.0–34.0)
MCHC: 35.1 g/dL (ref 32.0–36.0)
MCV: 90 fL (ref 80.0–100.0)
Platelets: 253 10*3/uL (ref 150–440)
RBC: 4.65 MIL/uL (ref 4.40–5.90)
RDW: 13.2 % (ref 11.5–14.5)
WBC: 10.7 10*3/uL — ABNORMAL HIGH (ref 3.8–10.6)

## 2016-08-27 LAB — COMPREHENSIVE METABOLIC PANEL
ALBUMIN: 4.5 g/dL (ref 3.5–5.0)
ALK PHOS: 58 U/L (ref 38–126)
ALT: 43 U/L (ref 17–63)
ANION GAP: 7 (ref 5–15)
AST: 31 U/L (ref 15–41)
BILIRUBIN TOTAL: 0.3 mg/dL (ref 0.3–1.2)
BUN: 17 mg/dL (ref 6–20)
CALCIUM: 9.1 mg/dL (ref 8.9–10.3)
CO2: 26 mmol/L (ref 22–32)
CREATININE: 0.79 mg/dL (ref 0.61–1.24)
Chloride: 106 mmol/L (ref 101–111)
GFR calc non Af Amer: >60 mL/min (ref >60)
GLUCOSE: 103 mg/dL — AB (ref 65–99)
Potassium: 3.8 mmol/L (ref 3.5–5.1)
Sodium: 139 mmol/L (ref 135–145)
TOTAL PROTEIN: 8.4 g/dL — AB (ref 6.5–8.1)

## 2016-08-27 LAB — URINALYSIS, ROUTINE W REFLEX MICROSCOPIC
BILIRUBIN URINE: NEGATIVE
Glucose, UA: NEGATIVE mg/dL
HGB URINE DIPSTICK: NEGATIVE
KETONES UR: 5 mg/dL — AB
Leukocytes, UA: NEGATIVE
NITRITE: NEGATIVE
PH: 6 (ref 5.0–8.0)
Protein, ur: NEGATIVE mg/dL
Specific Gravity, Urine: 1.033 — ABNORMAL HIGH (ref 1.005–1.030)

## 2016-08-27 LAB — LIPASE, BLOOD: Lipase: 13 U/L (ref 11–51)

## 2016-08-27 MED ORDER — IOPAMIDOL (ISOVUE-300) INJECTION 61%
100.0000 mL | Freq: Once | INTRAVENOUS | Status: AC | PRN
  Administered 2016-08-27: 100 mL via INTRAVENOUS

## 2016-08-27 MED ORDER — IOPAMIDOL (ISOVUE-300) INJECTION 61%
30.0000 mL | Freq: Once | INTRAVENOUS | Status: AC | PRN
  Administered 2016-08-27: 30 mL via ORAL

## 2016-08-27 MED ORDER — TRAMADOL HCL 50 MG PO TABS: 50.0000 mg | ORAL_TABLET | Freq: Four times a day (QID) | ORAL | 0 refills | Status: DC | PRN

## 2016-08-27 NOTE — ED Notes (Signed)

## 2016-08-27 NOTE — ED Notes (Signed)
Pt going to CT

## 2016-08-27 NOTE — ED Notes (Signed)
Report given to Kenny, RN

## 2016-08-27 NOTE — ED Provider Notes (Signed)
[facility] Emergency Department Provider Note  Time seen: 5:57 PM  I have reviewed the triage vital signs and the nursing notes.   HISTORY  Chief Complaint Abdominal Pain    HPI Kaleb Sek is a 26 y.o. male with a past medical history of migraines, left inguinal and umbilical hernia repairs performed 6 months ago who presents the emergency department for abdominal pain. According to the patient over the past one week he has been experiencing intermittent abdominal pain mostly in the left lower quadrant. He states over the past 2 days and has become much more frequent and more severe. Denies any nausea or vomiting states occasional episodes of diarrhea denies any constipation. Denies any dysuria or hematuria. Patient called his surgeon but cannot be seen until next week so he came to the emergency department.  Past Medical History:  Diagnosis Date  . Headache    MIGRAINES RARE  . Inguinal hernia    Left    Patient Active Problem List   Diagnosis Date Noted  . Left inguinal hernia 03/31/2016    Past Surgical History:  Procedure Laterality Date  . FOREIGN BODY REMOVAL     from ear- as child  . INGUINAL HERNIA REPAIR Left 03/12/2016   Procedure: LAPAROSCOPIC INGUINAL HERNIA;  Surgeon: Dia Crawford III, MD;  Location: [facility] ORS;  Service: General;  Laterality: Left;  . WISDOM TOOTH EXTRACTION      Prior to Admission medications   Medication Sig Start Date End Date Taking? Authorizing Provider  gabapentin (NEURONTIN) 300 MG capsule Take 1 capsule (300 mg total) by mouth 2 (two) times daily. 03/31/16   Olean Ree, MD  HYDROcodone-acetaminophen (NORCO) 5-325 MG tablet Take 1-2 tablets by mouth every 6 (six) hours as needed for moderate pain. 03/18/16   Clayburn Pert, MD    Allergies  Allergen Reactions  . Penicillins Hives    Family History  Problem Relation Age of Onset  . Cancer Mother     Brain  . Diabetes Mother   . COPD Father   . Heart disease  Father   . Healthy Sister     Social History Social History  Substance Use Topics  . Smoking status: Never Smoker  . Smokeless tobacco: Never Used  . Alcohol use Yes     Comment: 1-2 Beers weekly    Review of Systems Constitutional: Negative for fever. Cardiovascular: Negative for chest pain. Respiratory: Negative for shortness of breath. Gastrointestinal:Lower abdominal pain. Genitourinary: Negative for dysuria. Neurological: Negative for headache 10-point ROS otherwise negative.  ____________________________________________   PHYSICAL EXAM:  VITAL SIGNS: ED Triage Vitals  Enc Vitals Group     BP 08/27/16 1656 (!) 152/110     Pulse Rate 08/27/16 1656 89     Resp 08/27/16 1656 20     Temp 08/27/16 1656 98 F (36.7 C)     Temp src --      SpO2 08/27/16 1656 97 %     Weight 08/27/16 1657 250 lb (113.4 kg)     Height 08/27/16 1657 5\' 11"  (1.803 m)     Head Circumference --      Peak Flow --      Pain Score --      Pain Loc --      Pain Edu? --      Excl. in [place]? --     Constitutional: Alert and oriented. Well appearing and in no distress. Eyes: Normal exam ENT   Head: Normocephalic and atraumatic.  Mouth/Throat: Mucous membranes are moist. Cardiovascular: Normal rate, regular rhythm. No murmur Respiratory: Normal respiratory effort without tachypnea nor retractions. Breath sounds are clear  Gastrointestinal: Soft, moderate lower abdominal tenderness especially the left lower quadrant.  Musculoskeletal: Nontender with normal range of motion in all extremities.  Neurologic:  Normal speech and language. No gross focal neurologic deficits  Skin:  Skin is warm, dry and intact.  Psychiatric: Mood and affect are normal.   ____________________________________________   RADIOLOGY  CT negative  ____________________________________________   INITIAL IMPRESSION / ASSESSMENT AND PLAN / ED COURSE  Pertinent labs & imaging results that were available during  my care of the patient were reviewed by me and considered in my medical decision making (see chart for details).  The patient presents to the emergency department with lower abdominal pain, left lower quadrant tenderness. No rebound or guarding. No distention. Patient has fairly recent left inguinal hernia repair. Given the patient's left lower quadrant tenderness with recent hernia repair we will obtain labs and proceed with a CT abdomen/pelvis to rule out intra-abdominal pathology. Patient states the pain has been coming and going, increasing in frequency as well as duration as well as severity over the past one week. Denies any pain at rest currently but does have tenderness to palpation.  Labs are largely within normal limits. CT shows no acute findings. Patient has a follow-up appointment with his surgeon this coming week. We'll discharge with a short course of Ultram and surgery follow-up. Patient agreeable plan. ____________________________________________   FINAL CLINICAL IMPRESSION(S) / ED DIAGNOSES  Lower abdominal pain    Harvest Dark, MD 08/27/16 2025

## 2016-08-27 NOTE — ED Triage Notes (Signed)
Is scheduled to see the surgeon's office for follow since the pain is where the surgical site is, but they won't see him without an ultrasound

## 2016-08-27 NOTE — ED Triage Notes (Signed)
States has a workers comp case where he had a hernia with surgery sept 2017. States having abd pain - has called workers comp to reopen case and is requesting we do an ultrasound.

## 2016-09-02 DIAGNOSIS — R51 Headache: Principal | ICD-10-CM

## 2016-09-02 DIAGNOSIS — R519 Headache, unspecified: Secondary | ICD-10-CM | POA: Insufficient documentation

## 2016-09-03 ENCOUNTER — Encounter: Payer: Self-pay | Admitting: Surgery

## 2016-09-03 ENCOUNTER — Ambulatory Visit (INDEPENDENT_AMBULATORY_CARE_PROVIDER_SITE_OTHER): Payer: Worker's Compensation | Admitting: Surgery

## 2016-09-03 VITALS — BP 151/97 | HR 96 | Temp 98.4°F | Ht 71.0 in | Wt 265.2 lb

## 2016-09-03 DIAGNOSIS — K4090 Unilateral inguinal hernia, without obstruction or gangrene, not specified as recurrent: Secondary | ICD-10-CM | POA: Diagnosis not present

## 2016-09-03 DIAGNOSIS — R1032 Left lower quadrant pain: Secondary | ICD-10-CM | POA: Diagnosis not present

## 2016-09-03 NOTE — Patient Instructions (Addendum)
We will send you to a Urologist for your urinary symptoms and a Gastroenterologist for your Left sided abdominal pain. Those referrals will be placed today. If you do not hear from either office by next Friday, call our office so we may check on this for you.  Gastroenterologist: [facility], [address], [place] [address] [facility] [address], [place], [place] [address] [phone]   We are also requesting you see a PCP in regards to your elevated Blood Pressure. Please see the list provided.

## 2016-09-03 NOTE — Progress Notes (Addendum)
Outpatient Surgical Follow Up  09/03/2016  Scott Mckenzie is an 26 y.o. male.   CC: Left lower quadrant abdominal pain  HPI: This patient had a left inguinal hernia repair by Dr. Pat Patrick. Reviewing Dr. Rolin Barry operative report he did this transabdominally and noticed extensive adhesions of the left colon and sigmoid colon in the area of the hernia. These adhesions required dissection and he felt that they were due to prior inflammatory process of the colon. Patient describes right lower quadrant pain. He has normal bowel movements no melena or hematochezia. He does have urinary frequency hesitancy and often urgency and his stream starts and stops. He states that these urinary symptoms came on after the surgery and he did not have that previously. CT scan demonstrated a small right inguinal hernia and he has no symptoms concerning that. Past Medical History:  Diagnosis Date  . Headache    MIGRAINES RARE  . Inguinal hernia    Left    Past Surgical History:  Procedure Laterality Date  . FOREIGN BODY REMOVAL     from ear- as child  . INGUINAL HERNIA REPAIR Left 03/12/2016   Procedure: LAPAROSCOPIC INGUINAL HERNIA;  Surgeon: Dia Crawford III, MD;  Location: [facility] ORS;  Service: General;  Laterality: Left;  . WISDOM TOOTH EXTRACTION      Family History  Problem Relation Age of Onset  . Cancer Mother     Brain  . Diabetes Mother   . COPD Father   . Heart disease Father   . Healthy Sister     Social History:  reports that he has never smoked. He has never used smokeless tobacco. He reports that he drinks alcohol. He reports that he does not use drugs.  Allergies:  Allergies  Allergen Reactions  . Penicillins Hives    Medications reviewed.   Review of Systems:   Review of Systems  Constitutional: Negative for chills, fever and weight loss.  HENT: Negative.   Eyes: Negative.   Respiratory: Negative.   Cardiovascular: Negative.   Gastrointestinal: Positive for abdominal pain. Negative  for blood in stool, constipation, diarrhea, heartburn, melena, nausea and vomiting.  Genitourinary: Positive for dysuria, flank pain, frequency and urgency. Negative for hematuria.  Skin: Negative.   Neurological: Negative.   Endo/Heme/Allergies: Negative.   Psychiatric/Behavioral: Negative.      Physical Exam:  There were no vitals taken for this visit.  Physical Exam  Constitutional: He is oriented to person, place, and time and well-developed, well-nourished, and in no distress. No distress.  No acute distress Markedly elevated blood pressure  HENT:  Head: Normocephalic and atraumatic.  Eyes: Pupils are equal, round, and reactive to light. Right eye exhibits no discharge. Left eye exhibits no discharge. No scleral icterus.  Neck: Normal range of motion.  Cardiovascular: Normal rate and regular rhythm.   Pulmonary/Chest: Effort normal. No respiratory distress.  Abdominal: Soft. He exhibits no distension and no mass. There is tenderness. There is no rebound and no guarding.  Scars well-healed Tenderness in the left lateral quadrant not in the left groin no peritoneal signs  Genitourinary: Penis normal.  Genitourinary Comments: Testicles normal Patient examined standing supine and with Valsalva. Small right internal hernia reducible nontender No sign of recurrent left inguinal hernia nontender groin nontender internal ring. Normal testicle  Musculoskeletal: Normal range of motion. He exhibits no edema or tenderness.  Lymphadenopathy:    He has no cervical adenopathy.  Neurological: He is alert and oriented to person, place, and time.  Skin:  Skin is warm and dry. No rash noted. He is not diaphoretic. No erythema.  Psychiatric: Mood and affect normal.  Vitals reviewed.     No results found for this or any previous visit (from the past 48 hour(s)). No results found.  Assessment/Plan:  Dr. Pat Patrick had identified scar furcation of the left colon and sigmoid colon suggestive of  prior inflammatory process of unclear etiology. The CT scan did not make any mention in my personal review of the films show no sign of active inflammation in the area of the sigmoid colon. The patient's tenderness is much higher than the area where the mesh is placed and that makes me concerned that this is colonic in nature as opposed to postoperative pain.  The patient also describes multiple urinary symptoms which are new since surgery.  I would like to obtain a GI consultation as well as a urology consultation his symptoms do not make sense that this is related to the mesh or mesh placement and with Dr. Rolin Barry findings colonoscopy may be indicated.  Florene Glen, MD, FACS   Addendum: This patient called requesting clarification of my note. He was having trouble obtaining clearance from [employer]. for his consultation and further testing. After reviewing my notes and his emergency room notes and I agree that his chief complaint is probably left groin pain but the chief complaint registered in the emergency room was left lower quadrant pain. On my exam his tenderness is well above the inguinal ligament and not in the groin. This suggests an alternative but pathology to a recurrent inguinal hernia as there is no evidence of a recurrence. This coupled with Dr. Rolin Barry description of scar a fine: At the time of his laparoscopy is noted. His GU symptoms and his need for further colon workup by GI is not likely related to his previous hernia repair. This addendum is completed on 09/14/2016

## 2016-09-04 ENCOUNTER — Telehealth: Payer: Self-pay

## 2016-09-04 NOTE — Telephone Encounter (Signed)
I have put in an internal referral to [facility].   I will follow up within 3-5 days to make sure the appointments have been scheduled.

## 2016-09-09 ENCOUNTER — Telehealth: Payer: Self-pay | Admitting: Gastroenterology

## 2016-09-09 NOTE — Telephone Encounter (Signed)
Left a voice message for Amelia Jo [phone] claims examiner at [employer] to see if they will be paying for patient to come to GI.  Left a message on 3/19, 3/20, and 3/21.

## 2016-09-09 NOTE — Telephone Encounter (Signed)
Amelia Jo from [employer] left a voice message that Workers comp would deny the patient to come to GI. I called patient to tell him what she said and asked did he want to go ahead and schedule an appointment. He stated he was going to call Ms Freda Munro back then call me back.

## 2016-09-10 ENCOUNTER — Telehealth: Payer: Self-pay

## 2016-09-10 NOTE — Telephone Encounter (Signed)
Patient called today stating that he came in on 09/03/2016 to see you because he was having Left Groin pain and urinary incontinence.  Patient had a Left Laparoscopic Inguinal Hernia Repair on 03/12/2017 by Dr. Pat Mckenzie. Patient stated that the reason he was calling was because his worker's compensation will not cover his CT Scan, office visit and referrals to GI and Urology due to the office note not stating that he had Left Groin pain. Patient wanted to know if Scott Mckenzie could add an addendum to his note stating that he had Left Groin Pain. Patient believes that this pain is caused by his scar tissue from his surgery. Patient kept repeating himself telling me that he had told Scott Mckenzie about the groin pain. I told patient that I would have to send the surgeon a message and then I would get back to him. Patient understood and had no further question.

## 2016-09-15 NOTE — Telephone Encounter (Signed)
[facility] does not accept workers compensation.

## 2016-09-15 NOTE — Telephone Encounter (Signed)
I received a letter from [employer] stating that the referral visits to GI and Urology have been denied. I also followed up on the previous note documented. Dr. Burt Knack has added the addendum but states that the GI issues and Urology issues are not likely to be caused by the surgery. I explained this to the patient and he has asked me to fax over the office note with the addendum to [facility]. He also wanted to have his ED visit notes faxed over but I am unable to do that. I gave him the number to the hospital and told him to get transferred to medical records. Patient understood.

## 2016-09-22 NOTE — Telephone Encounter (Signed)
Patient's Workers compensation will not cover the referral to [facility] or to Urology. Patient has been advised.

## 2018-09-17 IMAGING — CT CT ABD-PELV W/ CM
2 of 4 series · 16 of 46 positions shown, 18 images · IV contrast (APPLIED)
Comparison: None.

CLINICAL DATA: Pt c/o bilatera lower abd pain. Hx of umbilical and
left inguinal hernia repair 6Sailor ago. ^100mL 3HOWAL-M66 IOPAMIDOL
([id number]-M66) INJECTION 61%

EXAM:
CT ABDOMEN AND PELVIS WITH CONTRAST
TECHNIQUE: Multidetector CT imaging of the abdomen and pelvis was performed
using the standard protocol following bolus administration of
intravenous contrast.
CONTRAST:  100mL [id number]-M66 IOPAMIDOL ([id number]-M66) INJECTION 61%

[Series 2: routine abd/pel with · axial · 0.91mm/px · z∈[-326,+214]mm · 13 of 118 slices shown, 15 images]
[im 5/118  soft-tissue]
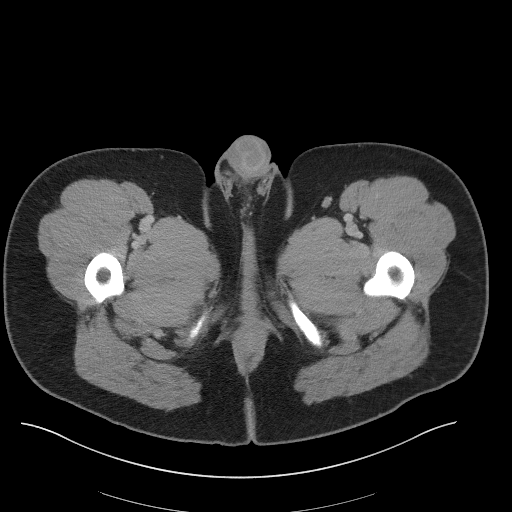
[im 5/118  bone]
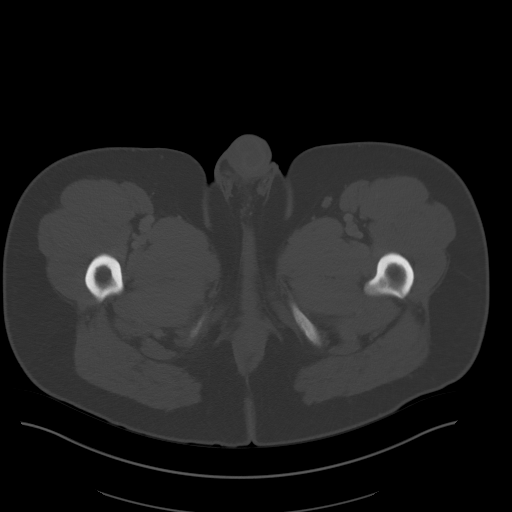
[im 15/118  soft-tissue]
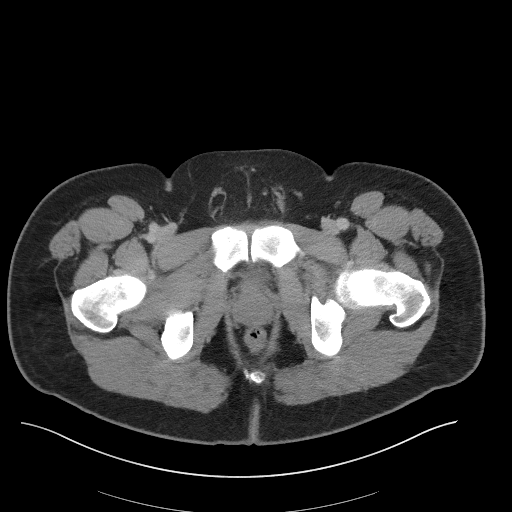
[im 25/118  soft-tissue]
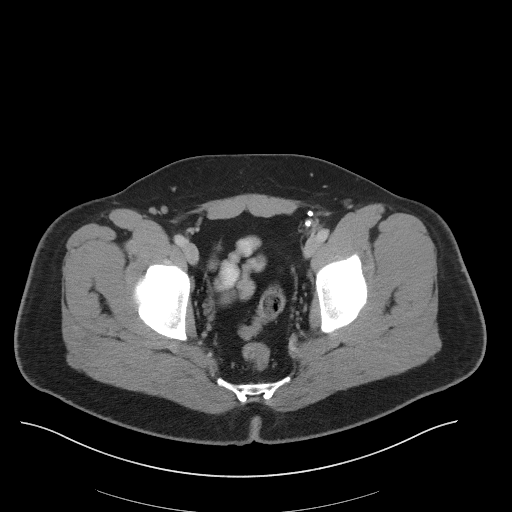
[im 35/118  soft-tissue]
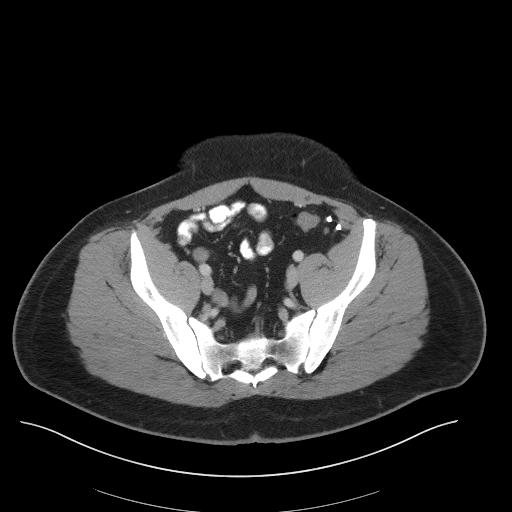
[im 40/118  soft-tissue]
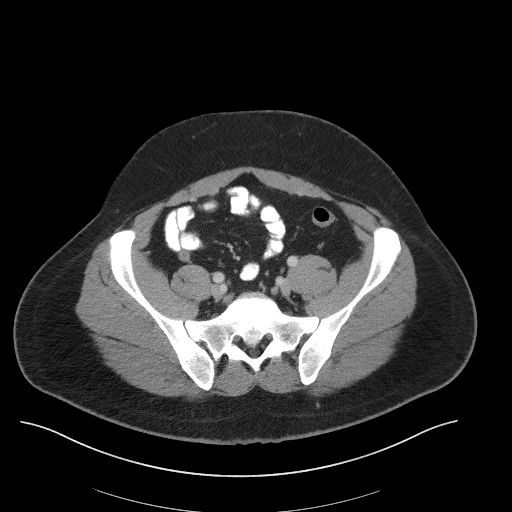
[im 49/118  soft-tissue]
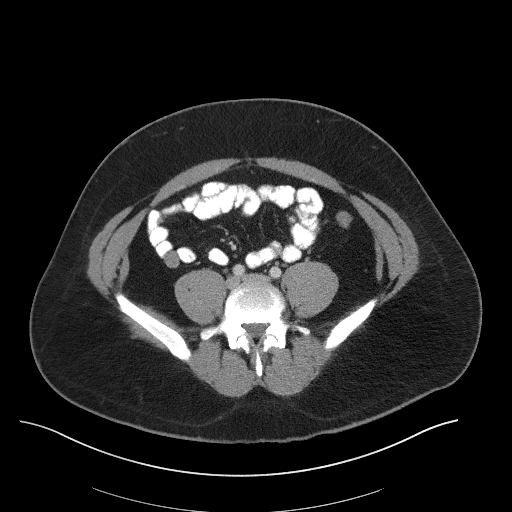
[im 59/118  soft-tissue]
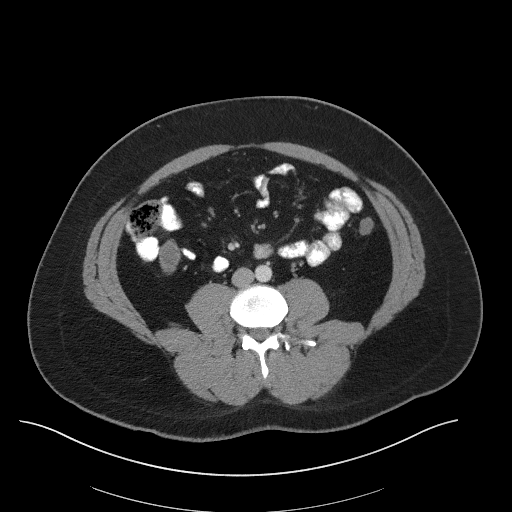
[im 69/118  soft-tissue]
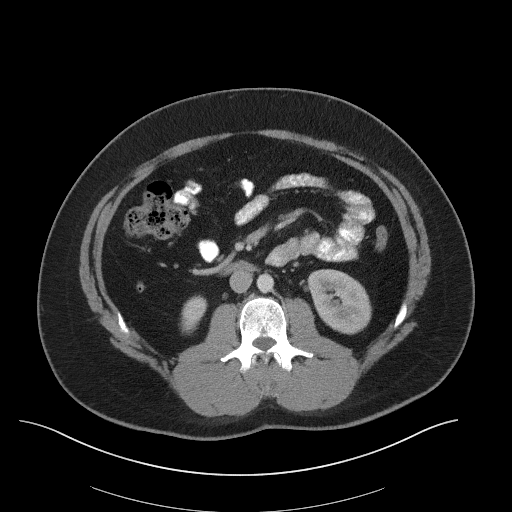
[im 79/118  soft-tissue]
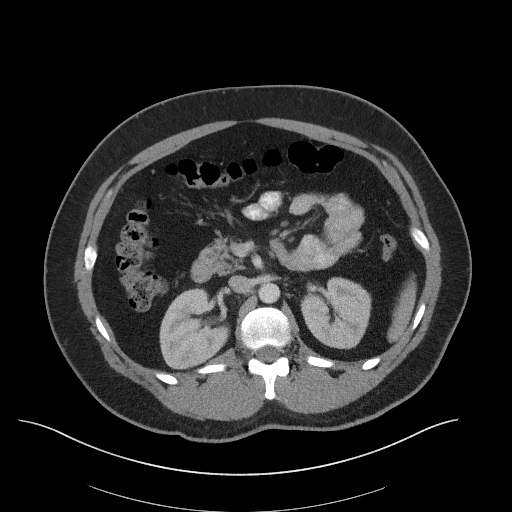
[im 79/118  bone]
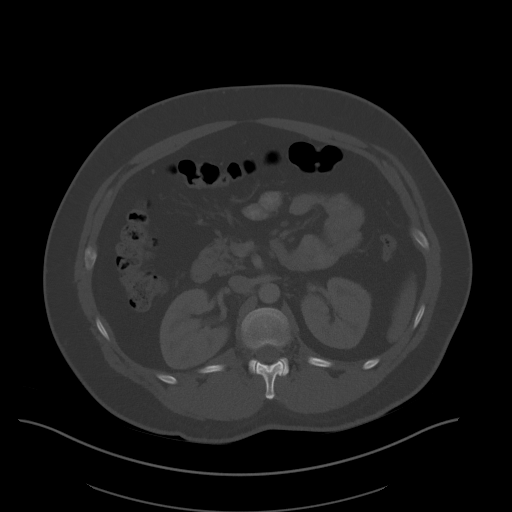
[im 83/118  soft-tissue]
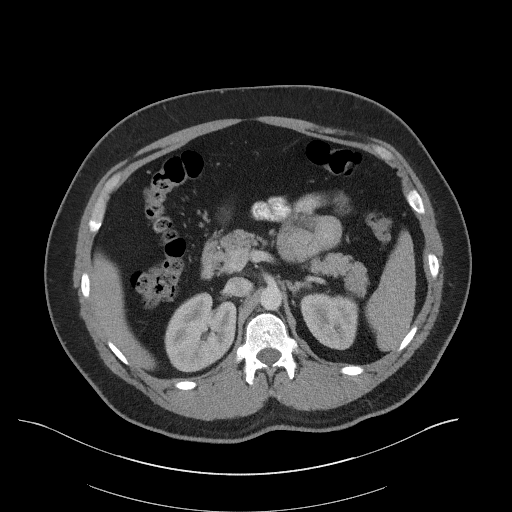
[im 93/118  soft-tissue]
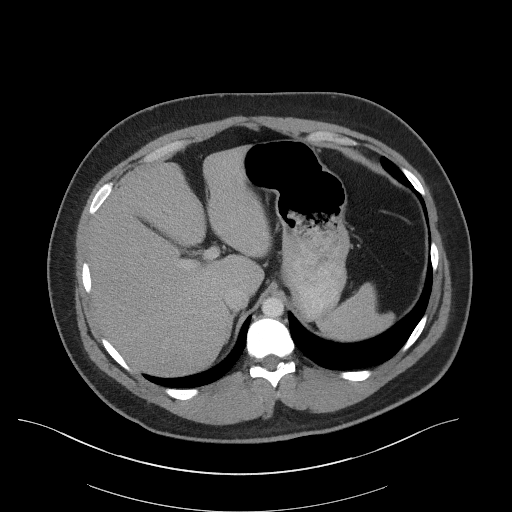
[im 103/118  soft-tissue]
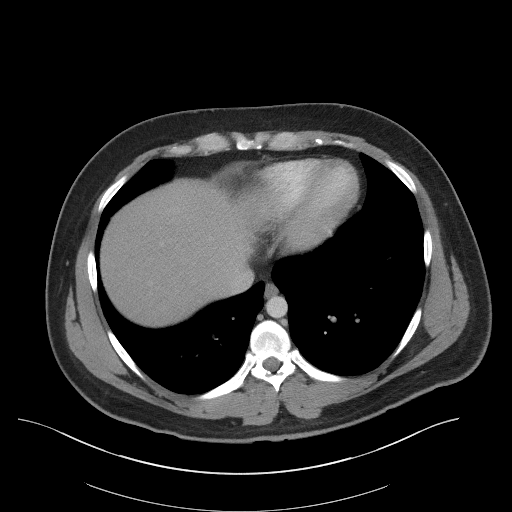
[im 113/118  soft-tissue]
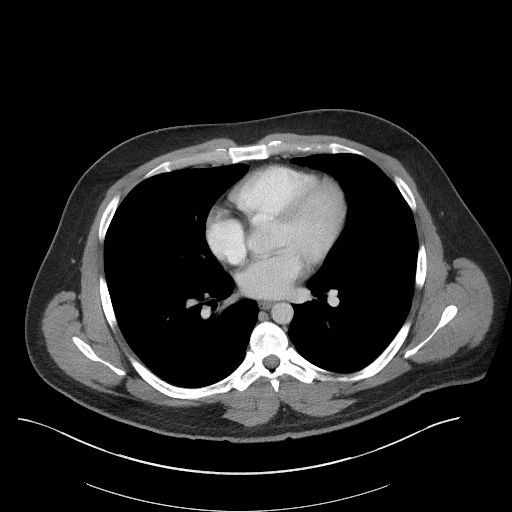

[Series 5: coronal st · coronal · 0.86mm/px · 3 of 104 slices shown]
[im 35/104  soft-tissue]
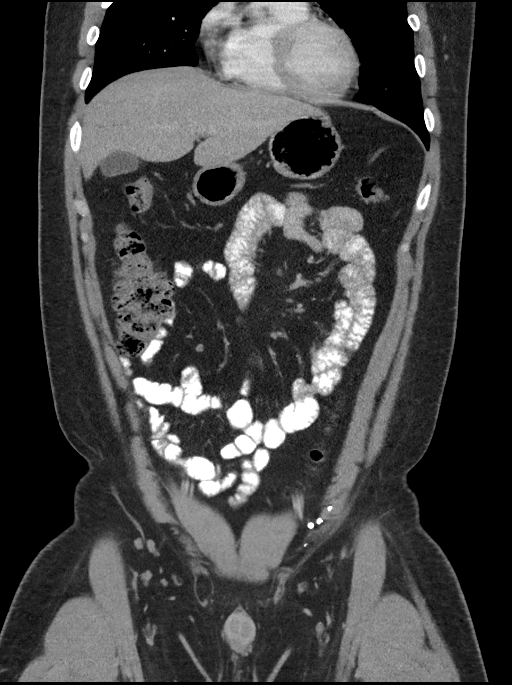
[im 46/104  soft-tissue]
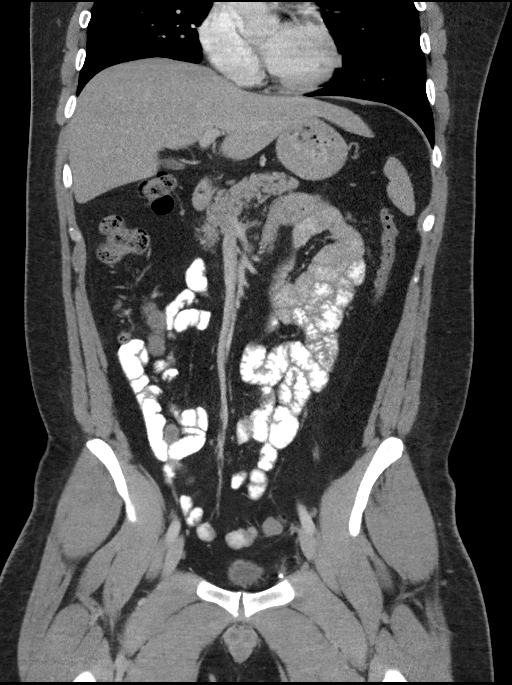
[im 58/104  soft-tissue]
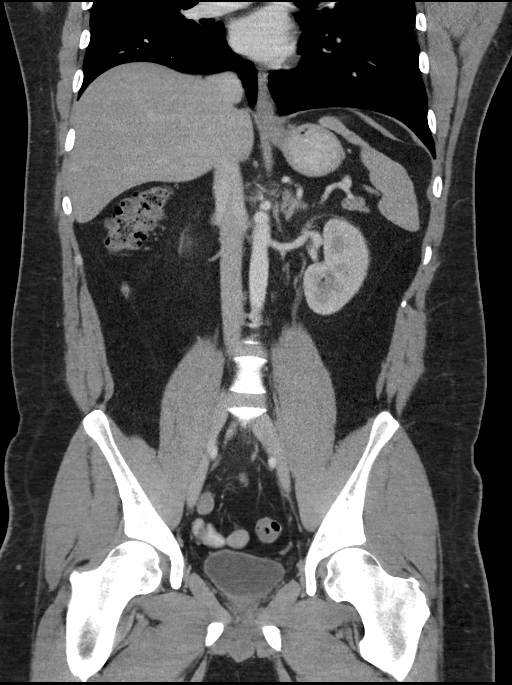

[16 of 46 positions shown; findings below may reference images not displayed]

FINDINGS: Lower chest: No acute abnormality.

Hepatobiliary: No focal liver abnormality is seen. No gallstones,
gallbladder wall thickening, or biliary dilatation.

Pancreas: Unremarkable. No pancreatic ductal dilatation or
surrounding inflammatory changes.

Spleen: Normal in size without focal abnormality.

Adrenals/Urinary Tract: Adrenal glands are unremarkable. Kidneys are
normal, without renal calculi, focal lesion, or hydronephrosis.
Bladder is unremarkable.

Stomach/Bowel: Stomach is within normal limits. Appendix appears
normal. No evidence of bowel wall thickening, distention, or
inflammatory changes.

Vascular/Lymphatic: No significant vascular findings are present. No
enlarged abdominal or pelvic lymph nodes.

Reproductive:  Normal prostate.

Other: No ascites.  No free air.

Musculoskeletal: Surgical clips from left inguinal hernia repair.
Small right inguinal hernia containing only fat. No fracture or
worrisome bone lesion.
IMPRESSION: 1. No acute findings.
